# Patient Record
Sex: Female | Born: 1950 | Race: White | Hispanic: No | Marital: Married | Smoking: Never smoker
Health system: Southern US, Community
[De-identification: ages and names within clinical notes are randomized; demographics above are authoritative.]

## PROBLEM LIST (undated history)

## (undated) DIAGNOSIS — R519 Headache, unspecified: Secondary | ICD-10-CM

## (undated) DIAGNOSIS — E039 Hypothyroidism, unspecified: Secondary | ICD-10-CM

## (undated) DIAGNOSIS — M810 Age-related osteoporosis without current pathological fracture: Secondary | ICD-10-CM

## (undated) HISTORY — PX: CATARACT EXTRACTION, BILATERAL: SHX1313

## (undated) HISTORY — PX: SHOULDER SURGERY: SHX246

## (undated) HISTORY — PX: LASIK: SHX215

---

## 2018-01-25 ENCOUNTER — Other Ambulatory Visit: Payer: Self-pay

## 2018-01-25 ENCOUNTER — Emergency Department (HOSPITAL_BASED_OUTPATIENT_CLINIC_OR_DEPARTMENT_OTHER): Payer: Medicare HMO

## 2018-01-25 ENCOUNTER — Emergency Department (HOSPITAL_BASED_OUTPATIENT_CLINIC_OR_DEPARTMENT_OTHER)
Admission: EM | Admit: 2018-01-25 | Discharge: 2018-01-26 | Disposition: A | Discharge status: Home / Self Care | Payer: Medicare HMO | Attending: Emergency Medicine | Admitting: Emergency Medicine

## 2018-01-25 ENCOUNTER — Encounter (HOSPITAL_BASED_OUTPATIENT_CLINIC_OR_DEPARTMENT_OTHER): Payer: Self-pay | Admitting: Emergency Medicine

## 2018-01-25 DIAGNOSIS — Y92000 Kitchen of unspecified non-institutional (private) residence as  the place of occurrence of the external cause: Secondary | ICD-10-CM | POA: Insufficient documentation

## 2018-01-25 DIAGNOSIS — Y999 Unspecified external cause status: Secondary | ICD-10-CM | POA: Insufficient documentation

## 2018-01-25 DIAGNOSIS — S42402A Unspecified fracture of lower end of left humerus, initial encounter for closed fracture: Secondary | ICD-10-CM | POA: Insufficient documentation

## 2018-01-25 DIAGNOSIS — S59902A Unspecified injury of left elbow, initial encounter: Secondary | ICD-10-CM | POA: Diagnosis present

## 2018-01-25 DIAGNOSIS — W19XXXA Unspecified fall, initial encounter: Secondary | ICD-10-CM

## 2018-01-25 DIAGNOSIS — W01198A Fall on same level from slipping, tripping and stumbling with subsequent striking against other object, initial encounter: Secondary | ICD-10-CM | POA: Diagnosis not present

## 2018-01-25 DIAGNOSIS — Y93G3 Activity, cooking and baking: Secondary | ICD-10-CM | POA: Insufficient documentation

## 2018-01-25 DIAGNOSIS — Y92009 Unspecified place in unspecified non-institutional (private) residence as the place of occurrence of the external cause: Secondary | ICD-10-CM

## 2018-01-25 NOTE — ED Notes (Signed)
Patient transported to X-ray 

## 2018-01-25 NOTE — ED Triage Notes (Signed)
Pt states she fell today and she is now having pain on from her left forearm to top of the elbow.

## 2018-01-25 NOTE — ED Notes (Signed)
ED Provider at bedside. 

## 2018-01-25 NOTE — ED Provider Notes (Signed)
[facility] Provider Note: Lowella Dell, MD, FACEP  CSN: [id number] MRN: [id number] ARRIVAL: 01/25/18 at 2304 ROOM: MH02/[facility]02   CHIEF COMPLAINT  Fall   HISTORY OF PRESENT ILLNESS  01/25/18 11:34 PM Sabrina Miller is a 67 y.o. female who was carrying some cookies in her kitchen about an hour prior to arrival.  She lost her balance and fell striking her left elbow and forearm against the dishwasher.  She is having pain about her left elbow which she rates as a 5 out of 10 at rest and 9 out of 10 with movement.  She denies injury elsewhere.  She has not taken anything for her pain.  She has no difficulty using her hand and wrist distally.   History reviewed. No pertinent past medical history.  History reviewed. No pertinent surgical history.  History reviewed. No pertinent family history.  Social History   Tobacco Use  . Smoking status: Never Smoker  . Smokeless tobacco: Never Used  Substance Use Topics  . Alcohol use: Never    Frequency: Never  . Drug use: Never    Prior to Admission medications   Not on File    Allergies Patient has no known allergies.   REVIEW OF SYSTEMS  Negative except as noted here or in the History of Present Illness.   PHYSICAL EXAMINATION  Initial Vital Signs Blood pressure 113/84, pulse 72, temperature 98 F (36.7 C), temperature source Oral, resp. rate 16, height 5\' 1"  (1.549 m), weight 62.5 kg, SpO2 100 %.  Examination General: Well-developed, well-nourished female in no acute distress; appearance consistent with age of record HENT: normocephalic; atraumatic Eyes: pupils equal, round and reactive to light; extraocular muscles intact Neck: supple; nontender Heart: regular rate and rhythm Lungs: clear to auscultation bilaterally Abdomen: soft; nondistended; nontender; bowel sounds present Extremities: No deformity; full range of motion except left elbow; pulses normal; tenderness of left elbow without ecchymosis or  swelling, left upper extremity distally neurovascularly intact Neurologic: Awake, alert and oriented; motor function intact in all extremities and symmetric; no facial droop Skin: Warm and dry Psychiatric: Normal mood and affect   RESULTS  Summary of this visit's results, reviewed by myself:   EKG Interpretation  Date/Time:    Ventricular Rate:    PR Interval:    QRS Duration:   QT Interval:    QTC Calculation:   R Axis:     Text Interpretation:        Laboratory Studies: No results found for this or any previous visit (from the past 24 hour(s)). Imaging Studies: Dg Elbow Complete Left  Result Date: 01/25/2018 CLINICAL DATA:  Fall with elbow pain EXAM: LEFT ELBOW - COMPLETE 3+ VIEW COMPARISON:  None. FINDINGS: Fat pad distention consistent with elbow effusion. No radial head dislocation. Calcification anterior to the distal cortex of the humerus, may reflect small avulsion injury. IMPRESSION: Elbow effusion with possible avulsion fracture fragment anterior to the distal humerus. Electronically Signed   By: Jasmine Pang M.D.   On: 01/25/2018 23:48   Dg Forearm Left  Result Date: 01/25/2018 CLINICAL DATA:  Fall with elbow pain EXAM: LEFT FOREARM - 2 VIEW COMPARISON:  None. FINDINGS: No acute displaced fracture or malalignment at the distal radius or ulna. Elbow effusion. Small suspected osseous fragment anterior to the distal humerus cortex. IMPRESSION: Suspected small displaced fracture fragment anterior to the distal cortex of the humerus. Elbow effusion is present Electronically Signed   By: Jasmine Pang M.D.   On: 01/25/2018  23:45    ED COURSE and MDM  Nursing notes and initial vitals signs, including pulse oximetry, reviewed.  Vitals:   01/25/18 2315  BP: 113/84  Pulse: 72  Resp: 16  Temp: 98 F (36.7 C)  TempSrc: Oral  SpO2: 100%  Weight: 62.5 kg  Height: 5\' 1"  (1.549 m)   Will place in splint and refer to Dr. Eulah PontMurphy, her orthopedist.  PROCEDURES    ED  DIAGNOSES     ICD-10-CM   1. Fall in home, initial encounter W19.XXXA    Y92.009   2. Elbow fracture, left, closed, initial encounter S42.402A        Early Ord, Jonny RuizJohn, MD 01/25/18 2356

## 2018-01-26 MED ORDER — IBUPROFEN 800 MG PO TABS
800.0000 mg | ORAL_TABLET | Freq: Once | ORAL | Status: AC
  Administered 2018-01-26: 800 mg via ORAL
  Filled 2018-01-26: qty 1

## 2018-01-28 ENCOUNTER — Other Ambulatory Visit: Payer: Self-pay

## 2018-01-28 DIAGNOSIS — M25522 Pain in left elbow: Secondary | ICD-10-CM

## 2018-01-31 ENCOUNTER — Ambulatory Visit
Admission: RE | Admit: 2018-01-31 | Discharge: 2018-01-31 | Disposition: A | Discharge status: Home / Self Care | Payer: Medicare HMO | Source: Ambulatory Visit | Attending: Orthopedic Surgery | Admitting: Orthopedic Surgery

## 2018-01-31 DIAGNOSIS — M25522 Pain in left elbow: Secondary | ICD-10-CM

## 2019-06-06 IMAGING — CT CT ELBOW*L* W/O CM
4 of 10 series · 13 of 39 positions shown, 15 images · non-contrast
Comparison: Plain films left elbow 01/25/2018.

CLINICAL DATA: Patient suffered a distal left humerus fracture due
to a trip and fall over a dishwasher 01/25/2018. Initial encounter.

EXAM:
CT OF THE UPPER LEFT EXTREMITY WITHOUT CONTRAST
TECHNIQUE: Multidetector CT imaging of the upper left extremity was performed
according to the standard protocol.

[Series 3: elbow 1.50 br60 s3 ax axial bone hd fov · axial · 0.29mm/px · z∈[-960,-835]mm · 5 of 237 slices shown, 7 images]
[im 40/237  soft-tissue]
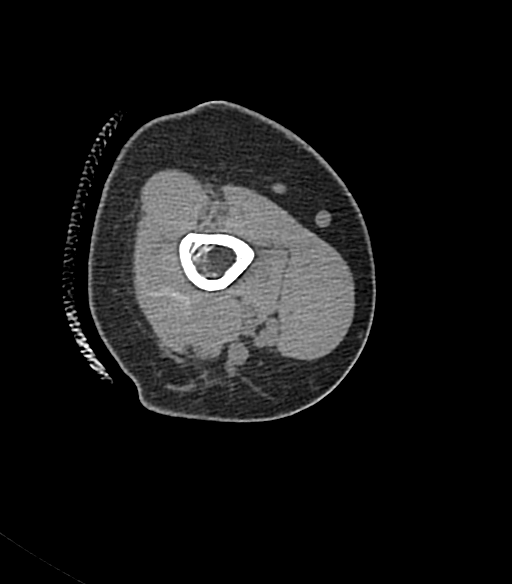
[im 40/237  bone]
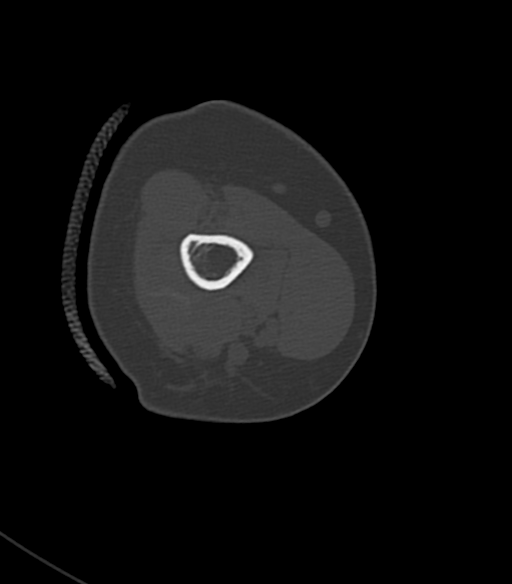
[im 79/237  bone]
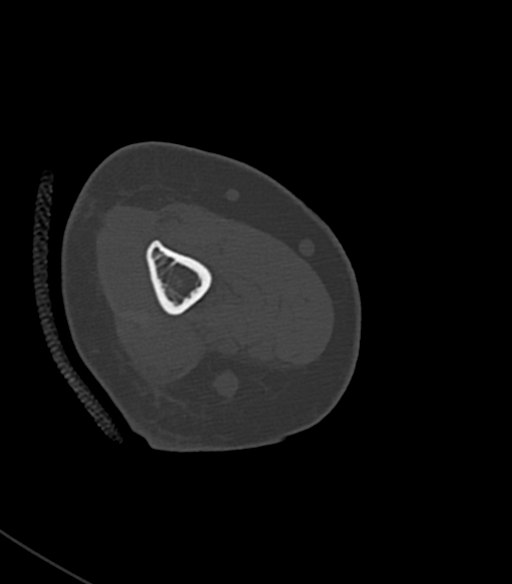
[im 119/237  bone]
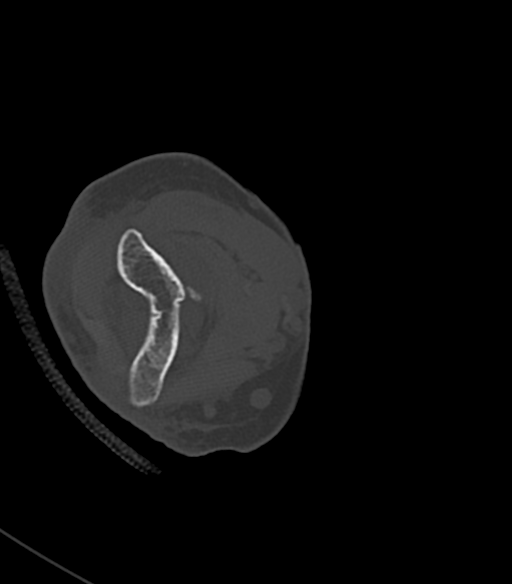
[im 158/237  bone]
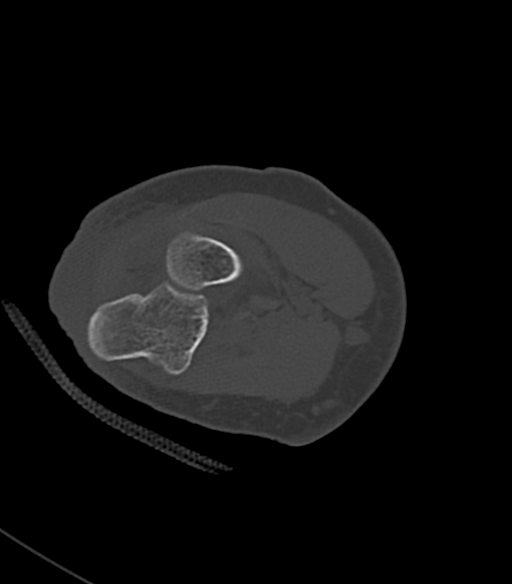
[im 197/237  soft-tissue]
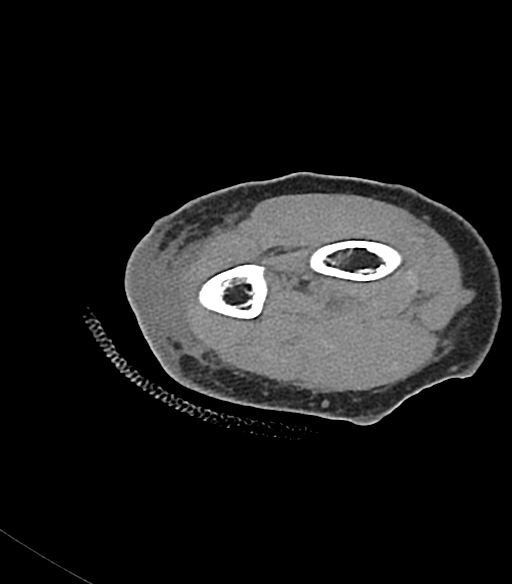
[im 197/237  bone]
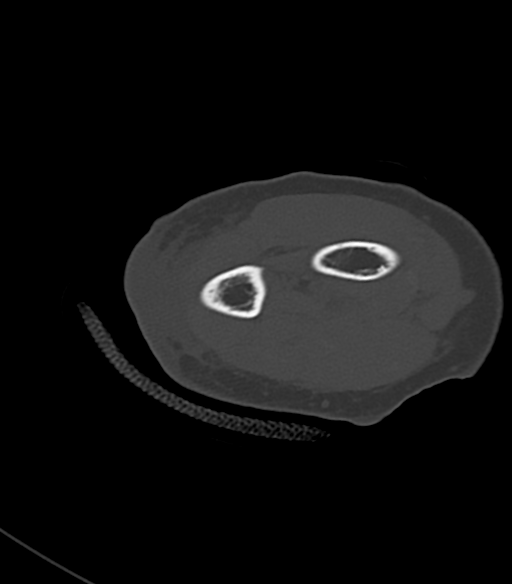

[Series 5: elbow 1.50 br40 s3 ax axial st hd fov · axial · 0.29mm/px · z∈[-960,-835]mm · 5 of 237 slices shown]
[im 40/237  bone]
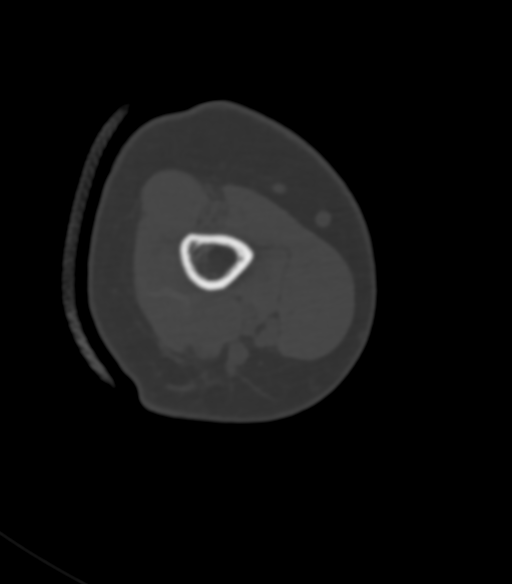
[im 79/237  bone]
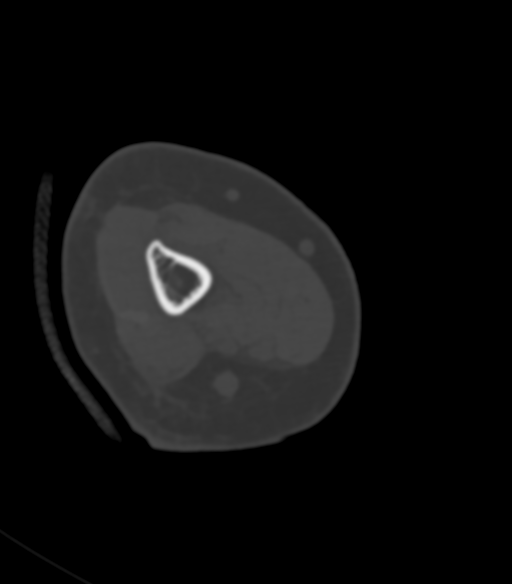
[im 119/237  bone]
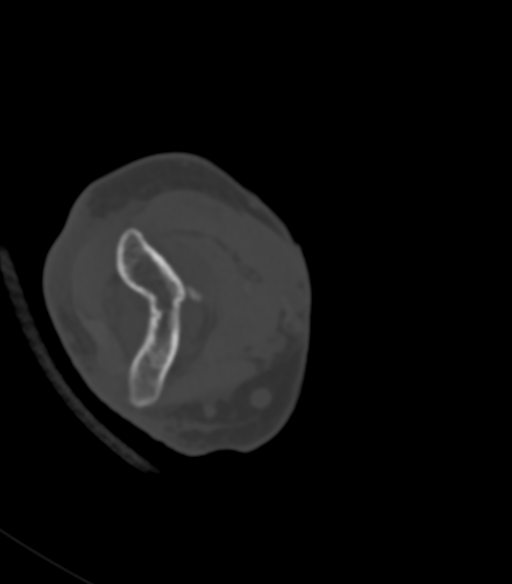
[im 158/237  bone]
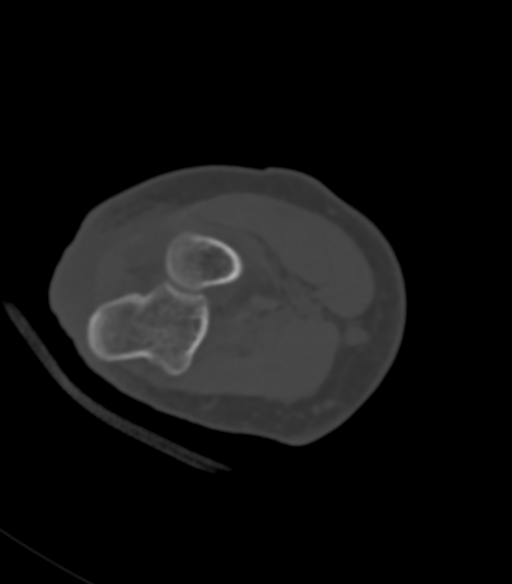
[im 197/237  bone]
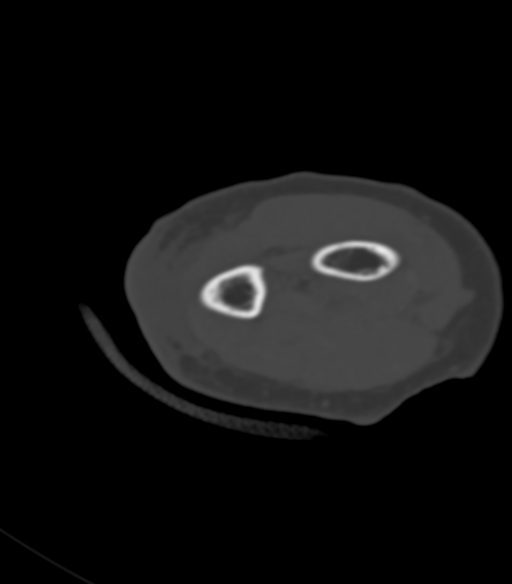

[Series 9: elbow 1.50 hr60 s3 sag cor bone hd fov · sagittal · 0.34mm/px · 2 of 135 slices shown]
[im 46/135  soft-tissue]
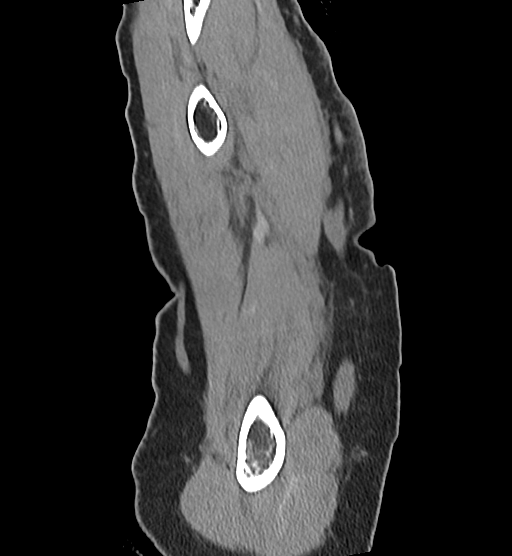
[im 68/135  bone]
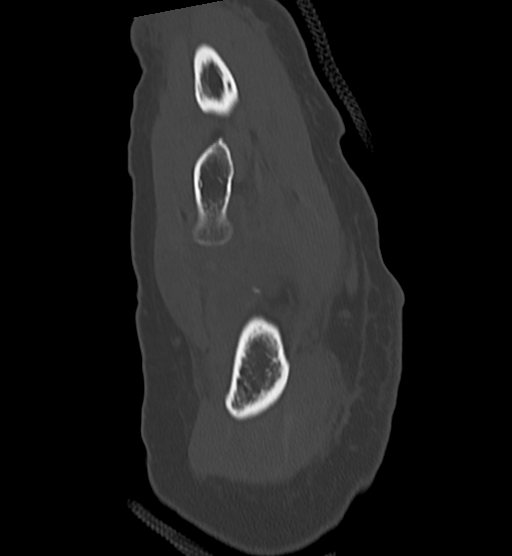

[Series 13: elbow 1.50 br60 s3 cor sag bone hd fov · coronal · 0.24mm/px · 1 of 145 slices shown]
[im 73/145  bone]
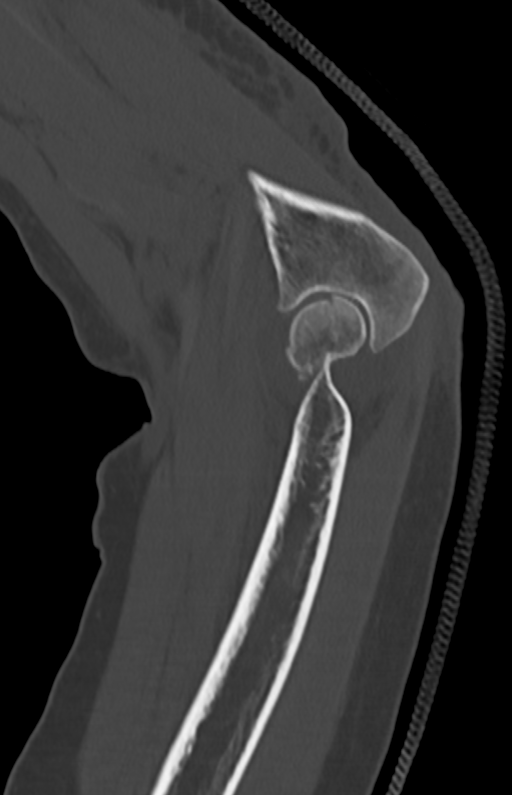

[13 of 39 positions shown; findings below may reference images not displayed]

FINDINGS: Bones/Joint/Cartilage

As seen on the comparison plain films, the patient has a fracture of
the distal humerus. The capitellum is impacted approximately 0.3 cm
and anteriorly displaced 0.5 cm. The majority of the articular
surface of the capitellum is not in contact with the articular
surface of the radial head. The far lateral periphery of the
capitellum is mildly comminuted but the vast majority of its
articular surface is intact. The patient's fracture extends through
the central aspect of the trochlea without displacement. The medial
and lateral epicondyles are spared. The radius and ulna are intact.

Ligaments

Suboptimally assessed by CT.

Muscles and Tendons

Intact.

Soft tissues

The patient has a moderate elbow joint effusion. A bone fragment
measuring 0.3 cm is seen in the anterior aspect of the joint and
likely a fracture fragment. Fluid and hemorrhage are in the
olecranon bursa.
IMPRESSION: Fracture of the distal humerus as described above. The capitellum is
mildly impacted anterior displaced. The bulk of the capitellum does
not articulate with the radius. Nondisplaced component of the
fracture extends through the central trochlea.

Elbow joint effusion with a 0.3 cm fracture fragment within it.
Hemorrhage in the olecranon bursa is noted.

## 2019-08-05 ENCOUNTER — Other Ambulatory Visit (HOSPITAL_COMMUNITY)
Admission: RE | Admit: 2019-08-05 | Discharge: 2019-08-05 | Disposition: A | Discharge status: Home / Self Care | Payer: Medicare HMO | Source: Ambulatory Visit | Attending: Orthopaedic Surgery | Admitting: Orthopaedic Surgery

## 2019-08-05 ENCOUNTER — Encounter (HOSPITAL_BASED_OUTPATIENT_CLINIC_OR_DEPARTMENT_OTHER): Payer: Self-pay | Admitting: Orthopaedic Surgery

## 2019-08-05 ENCOUNTER — Other Ambulatory Visit: Payer: Self-pay

## 2019-08-05 DIAGNOSIS — Z01812 Encounter for preprocedural laboratory examination: Secondary | ICD-10-CM | POA: Diagnosis present

## 2019-08-05 DIAGNOSIS — Z20822 Contact with and (suspected) exposure to covid-19: Secondary | ICD-10-CM | POA: Diagnosis not present

## 2019-08-05 LAB — SARS CORONAVIRUS 2 (TAT 6-24 HRS): SARS Coronavirus 2: NEGATIVE

## 2019-08-05 NOTE — H&P (Signed)
PREOPERATIVE H&P  Chief Complaint: RIGHT WRIST FRACTURE  HPI: Sabrina Miller is a 69 y.o. female who is scheduled for OPEN REDUCTION INTERNAL FIXATION (ORIF) DISTAL RADIAL FRACTURE.   Patient has a past medical history significant for osteoporosis and hyperthyroidism.   Patient had an injury on 08/05/2019 when she fell landing on an outstretched hand. She had immediate pain in her right wrist.   Her symptoms are rated as moderate to severe, and have been worsening.  This is significantly impairing activities of daily living.    Please see clinic note for further details on this patient's care.    She has elected for surgical management.   Past Medical History:  Diagnosis Date  . Headache   . Hypothyroidism   . Osteoporosis    Past Surgical History:  Procedure Laterality Date  . LASIK    . SHOULDER SURGERY     Social History   Socioeconomic History  . Marital status: Married    Spouse name: Not on file  . Number of children: Not on file  . Years of education: Not on file  . Highest education level: Not on file  Occupational History  . Not on file  Tobacco Use  . Smoking status: Never Smoker  . Smokeless tobacco: Never Used  Substance and Sexual Activity  . Alcohol use: Never  . Drug use: Never  . Sexual activity: Never  Other Topics Concern  . Not on file  Social History Narrative  . Not on file   Social Determinants of Health   Financial Resource Strain:   . Difficulty of Paying Living Expenses:   Food Insecurity:   . Worried About Charity fundraiser in the Last Year:   . Arboriculturist in the Last Year:   Transportation Needs:   . Film/video editor (Medical):   Marland Kitchen Lack of Transportation (Non-Medical):   Physical Activity:   . Days of Exercise per Week:   . Minutes of Exercise per Session:   Stress:   . Feeling of Stress :   Social Connections:   . Frequency of Communication with Friends and Family:   . Frequency of Social Gatherings with  Friends and Family:   . Attends Religious Services:   . Active Member of Clubs or Organizations:   . Attends Archivist Meetings:   Marland Kitchen Marital Status:    History reviewed. No pertinent family history. Allergies  Allergen Reactions  . Codeine Nausea And Vomiting   Prior to Admission medications   Medication Sig Start Date End Date Taking? Authorizing Provider  aspirin EC 81 MG tablet Take 81 mg by mouth daily. Swallow whole.   Yes [provider]  calcium-vitamin D (OSCAL WITH D) 250-125 MG-UNIT tablet Take 1 tablet by mouth daily.   Yes [provider]    ROS: All other systems have been reviewed and were otherwise negative with the exception of those mentioned in the HPI and as above.  Physical Exam: General: Alert, no acute distress Cardiovascular: No pedal edema Respiratory: No cyanosis, no use of accessory musculature GI: No organomegaly, abdomen is soft and non-tender Skin: No lesions in the area of chief complaint Neurologic: Sensation intact distally Psychiatric: Patient is competent for consent with normal mood and affect Lymphatic: No axillary or cervical lymphadenopathy  MUSCULOSKELETAL:  Right wrist: tender to palpation about distal radius. ROM limited due to pain. Warm well perfused hand  Imaging: Xrays of right wrist demonstrate distal radius fracture  Assessment: RIGHT WRIST FRACTURE  Plan: Plan for Procedure(s): OPEN REDUCTION INTERNAL FIXATION (ORIF) DISTAL RADIAL FRACTURE  The risks benefits and alternatives were discussed with the patient including but not limited to the risks of nonoperative treatment, versus surgical intervention including infection, bleeding, nerve injury,  blood clots, cardiopulmonary complications, morbidity, mortality, among others, and they were willing to proceed.   The patient acknowledged the explanation, agreed to proceed with the plan and consent was signed.   Operative Plan: ORIF right distal  radius fracture Discharge Medications: Standard DVT Prophylaxis: None Physical Therapy: +/- outpatient OT Special Discharge needs: Splint. Sling for comfort    Vernetta Honey, PA-C  08/05/2019 5:02 PM

## 2019-08-06 ENCOUNTER — Ambulatory Visit (HOSPITAL_BASED_OUTPATIENT_CLINIC_OR_DEPARTMENT_OTHER): Payer: Medicare HMO | Admitting: Anesthesiology

## 2019-08-06 ENCOUNTER — Other Ambulatory Visit: Payer: Self-pay

## 2019-08-06 ENCOUNTER — Encounter (HOSPITAL_BASED_OUTPATIENT_CLINIC_OR_DEPARTMENT_OTHER): Payer: Self-pay | Admitting: Orthopaedic Surgery

## 2019-08-06 ENCOUNTER — Ambulatory Visit (HOSPITAL_BASED_OUTPATIENT_CLINIC_OR_DEPARTMENT_OTHER)
Admission: RE | Admit: 2019-08-06 | Discharge: 2019-08-06 | Disposition: A | Discharge status: Home / Self Care | Payer: Medicare HMO | Source: Ambulatory Visit | Attending: Orthopaedic Surgery | Admitting: Orthopaedic Surgery

## 2019-08-06 ENCOUNTER — Encounter (HOSPITAL_BASED_OUTPATIENT_CLINIC_OR_DEPARTMENT_OTHER)
Admission: RE | Disposition: A | Discharge status: Home / Self Care | Payer: Self-pay | Source: Ambulatory Visit | Attending: Orthopaedic Surgery

## 2019-08-06 DIAGNOSIS — W19XXXA Unspecified fall, initial encounter: Secondary | ICD-10-CM | POA: Insufficient documentation

## 2019-08-06 DIAGNOSIS — Z7982 Long term (current) use of aspirin: Secondary | ICD-10-CM | POA: Diagnosis not present

## 2019-08-06 DIAGNOSIS — Z885 Allergy status to narcotic agent status: Secondary | ICD-10-CM | POA: Diagnosis not present

## 2019-08-06 DIAGNOSIS — S52551A Other extraarticular fracture of lower end of right radius, initial encounter for closed fracture: Secondary | ICD-10-CM | POA: Diagnosis not present

## 2019-08-06 HISTORY — DX: Headache, unspecified: R51.9

## 2019-08-06 HISTORY — DX: Hypothyroidism, unspecified: E03.9

## 2019-08-06 HISTORY — PX: OPEN REDUCTION INTERNAL FIXATION (ORIF) DISTAL RADIAL FRACTURE: SHX5989

## 2019-08-06 HISTORY — DX: Age-related osteoporosis without current pathological fracture: M81.0

## 2019-08-06 SURGERY — OPEN REDUCTION INTERNAL FIXATION (ORIF) DISTAL RADIUS FRACTURE
Anesthesia: Regional | Site: Wrist | Laterality: Right

## 2019-08-06 MED ORDER — PROPOFOL 500 MG/50ML IV EMUL
INTRAVENOUS | Status: DC | PRN
  Administered 2019-08-06: 50 ug/kg/min via INTRAVENOUS

## 2019-08-06 MED ORDER — CLONIDINE HCL (ANALGESIA) 100 MCG/ML EP SOLN
EPIDURAL | Status: DC | PRN
  Administered 2019-08-06: 80 ug

## 2019-08-06 MED ORDER — ACETAMINOPHEN 500 MG PO TABS
1000.0000 mg | ORAL_TABLET | Freq: Three times a day (TID) | ORAL | 0 refills | Status: AC
Start: 2019-08-06 — End: 2019-08-20

## 2019-08-06 MED ORDER — ROPIVACAINE HCL 7.5 MG/ML IJ SOLN
INTRAMUSCULAR | Status: DC | PRN
  Administered 2019-08-06: 40 mL via PERINEURAL

## 2019-08-06 MED ORDER — LACTATED RINGERS IV SOLN: INTRAVENOUS | Status: DC

## 2019-08-06 MED ORDER — VANCOMYCIN HCL 1000 MG IV SOLR
INTRAVENOUS | Status: DC | PRN
  Administered 2019-08-06: 1000 mg

## 2019-08-06 MED ORDER — PROPOFOL 10 MG/ML IV BOLUS
INTRAVENOUS | Status: DC | PRN
  Administered 2019-08-06 (×2): 10 mg via INTRAVENOUS

## 2019-08-06 MED ORDER — CEFAZOLIN SODIUM-DEXTROSE 2-4 GM/100ML-% IV SOLN
2.0000 g | INTRAVENOUS | Status: AC
  Administered 2019-08-06: 2 g via INTRAVENOUS

## 2019-08-06 MED ORDER — MIDAZOLAM HCL 2 MG/2ML IJ SOLN
2.0000 mg | Freq: Once | INTRAMUSCULAR | Status: AC
  Administered 2019-08-06: 2 mg via INTRAVENOUS

## 2019-08-06 MED ORDER — PROPOFOL 500 MG/50ML IV EMUL
INTRAVENOUS | Status: AC
  Filled 2019-08-06: qty 50

## 2019-08-06 MED ORDER — FENTANYL CITRATE (PF) 100 MCG/2ML IJ SOLN
INTRAMUSCULAR | Status: AC
  Filled 2019-08-06: qty 2

## 2019-08-06 MED ORDER — CEFAZOLIN SODIUM-DEXTROSE 2-4 GM/100ML-% IV SOLN
INTRAVENOUS | Status: AC
  Filled 2019-08-06: qty 100

## 2019-08-06 MED ORDER — FENTANYL CITRATE (PF) 100 MCG/2ML IJ SOLN
50.0000 ug | Freq: Once | INTRAMUSCULAR | Status: AC
  Administered 2019-08-06: 50 ug via INTRAVENOUS

## 2019-08-06 MED ORDER — ONDANSETRON HCL 4 MG/2ML IJ SOLN
INTRAMUSCULAR | Status: AC
  Filled 2019-08-06: qty 2

## 2019-08-06 MED ORDER — DEXAMETHASONE SODIUM PHOSPHATE 4 MG/ML IJ SOLN
INTRAMUSCULAR | Status: DC | PRN
Start: 2019-08-06 — End: 2019-08-06
  Administered 2019-08-06: 8 mg via PERINEURAL

## 2019-08-06 MED ORDER — MELOXICAM 7.5 MG PO TABS
7.5000 mg | ORAL_TABLET | Freq: Every day | ORAL | 0 refills | Status: AC
Start: 2019-08-06 — End: 2019-09-05

## 2019-08-06 MED ORDER — ONDANSETRON HCL 4 MG/2ML IJ SOLN
INTRAMUSCULAR | Status: DC | PRN
  Administered 2019-08-06: 4 mg via INTRAVENOUS

## 2019-08-06 MED ORDER — ONDANSETRON HCL 4 MG PO TABS: 4.0000 mg | ORAL_TABLET | Freq: Three times a day (TID) | ORAL | 1 refills | Status: AC | PRN

## 2019-08-06 MED ORDER — VANCOMYCIN HCL 1000 MG IV SOLR
INTRAVENOUS | Status: AC
  Filled 2019-08-06: qty 1000

## 2019-08-06 MED ORDER — MIDAZOLAM HCL 2 MG/2ML IJ SOLN
INTRAMUSCULAR | Status: AC
  Filled 2019-08-06: qty 2

## 2019-08-06 MED ORDER — EPHEDRINE SULFATE 50 MG/ML IJ SOLN
INTRAMUSCULAR | Status: DC | PRN
Start: 2019-08-06 — End: 2019-08-06
  Administered 2019-08-06 (×2): 5 mg via INTRAVENOUS

## 2019-08-06 MED ORDER — OXYCODONE HCL 5 MG PO TABS: ORAL_TABLET | ORAL | 0 refills | Status: AC

## 2019-08-06 SURGICAL SUPPLY — 71 items
BIT DRILL 2.2 SS TIBIAL (BIT) ×3 IMPLANT
BLADE HEX COATED 2.75 (ELECTRODE) ×3 IMPLANT
BLADE SURG 15 STRL LF DISP TIS (BLADE) ×1 IMPLANT
BLADE SURG 15 STRL SS (BLADE) ×2
BNDG COHESIVE 3X5 TAN STRL LF (GAUZE/BANDAGES/DRESSINGS) ×3 IMPLANT
BNDG ELASTIC 3X5.8 VLCR STR LF (GAUZE/BANDAGES/DRESSINGS) ×3 IMPLANT
BNDG ELASTIC 4X5.8 VLCR STR LF (GAUZE/BANDAGES/DRESSINGS) ×3 IMPLANT
BNDG ESMARK 4X9 LF (GAUZE/BANDAGES/DRESSINGS) ×3 IMPLANT
BRUSH SCRUB EZ PLAIN DRY (MISCELLANEOUS) IMPLANT
CANISTER SUCT 1200ML W/VALVE (MISCELLANEOUS) IMPLANT
CLOSURE STERI-STRIP 1/2X4 (GAUZE/BANDAGES/DRESSINGS) ×1
CLSR STERI-STRIP ANTIMIC 1/2X4 (GAUZE/BANDAGES/DRESSINGS) ×2 IMPLANT
COVER BACK TABLE 60X90IN (DRAPES) ×3 IMPLANT
COVER WAND RF STERILE (DRAPES) IMPLANT
CUFF TOURN SGL QUICK 18X4 (TOURNIQUET CUFF) ×3 IMPLANT
DECANTER SPIKE VIAL GLASS SM (MISCELLANEOUS) IMPLANT
DRAPE EXTREMITY T 121X128X90 (DISPOSABLE) ×3 IMPLANT
DRAPE IMP U-DRAPE 54X76 (DRAPES) ×3 IMPLANT
DRAPE OEC MINIVIEW 54X84 (DRAPES) ×3 IMPLANT
DRAPE SURG 17X23 STRL (DRAPES) ×3 IMPLANT
ELECT REM PT RETURN 9FT ADLT (ELECTROSURGICAL) ×3
ELECTRODE REM PT RTRN 9FT ADLT (ELECTROSURGICAL) ×1 IMPLANT
GAUZE SPONGE 4X4 12PLY STRL (GAUZE/BANDAGES/DRESSINGS) ×3 IMPLANT
GLOVE BIO SURGEON STRL SZ 6.5 (GLOVE) ×2 IMPLANT
GLOVE BIO SURGEONS STRL SZ 6.5 (GLOVE) ×1
GLOVE BIOGEL PI IND STRL 6.5 (GLOVE) ×1 IMPLANT
GLOVE BIOGEL PI IND STRL 7.0 (GLOVE) ×1 IMPLANT
GLOVE BIOGEL PI IND STRL 8 (GLOVE) ×1 IMPLANT
GLOVE BIOGEL PI INDICATOR 6.5 (GLOVE) ×2
GLOVE BIOGEL PI INDICATOR 7.0 (GLOVE) ×2
GLOVE BIOGEL PI INDICATOR 8 (GLOVE) ×2
GLOVE ECLIPSE 6.5 STRL STRAW (GLOVE) ×3 IMPLANT
GLOVE ECLIPSE 8.0 STRL XLNG CF (GLOVE) ×3 IMPLANT
GOWN STRL REUS W/ TWL LRG LVL3 (GOWN DISPOSABLE) ×2 IMPLANT
GOWN STRL REUS W/ TWL XL LVL3 (GOWN DISPOSABLE) IMPLANT
GOWN STRL REUS W/TWL LRG LVL3 (GOWN DISPOSABLE) ×4
GOWN STRL REUS W/TWL XL LVL3 (GOWN DISPOSABLE) ×3 IMPLANT
K-WIRE 1.6 (WIRE) ×6
K-WIRE FX5X1.6XNS BN SS (WIRE) ×3
KWIRE FX5X1.6XNS BN SS (WIRE) ×3 IMPLANT
NEEDLE HYPO 25X1 1.5 SAFETY (NEEDLE) ×3 IMPLANT
NS IRRIG 1000ML POUR BTL (IV SOLUTION) ×3 IMPLANT
PAD CAST 4YDX4 CTTN HI CHSV (CAST SUPPLIES) ×1 IMPLANT
PADDING CAST COTTON 4X4 STRL (CAST SUPPLIES) ×2
PEG LOCKING SMOOTH 2.2X18 (Peg) ×3 IMPLANT
PEG LOCKING SMOOTH 2.2X20 (Screw) ×12 IMPLANT
PENCIL SMOKE EVACUATOR (MISCELLANEOUS) ×3 IMPLANT
PLATE CROSSLOCK NAR MINI RT (Plate) ×3 IMPLANT
SCREW LOCK 14X2.7X 3 LD TPR (Screw) ×3 IMPLANT
SCREW LOCKING 2.7X14 (Screw) ×6 IMPLANT
SCREW NONLOCK 2.7X18MM (Screw) ×3 IMPLANT
SET BASIN DAY SURGERY F.S. (CUSTOM PROCEDURE TRAY) ×3 IMPLANT
SLEEVE SCD COMPRESS KNEE MED (MISCELLANEOUS) IMPLANT
SLING ARM FOAM STRAP MED (SOFTGOODS) ×3 IMPLANT
SPLINT FAST PLASTER 5X30 (CAST SUPPLIES) ×10
SPLINT PLASTER CAST FAST 5X30 (CAST SUPPLIES) ×5 IMPLANT
SPLINT PLASTER CAST XFAST 3X15 (CAST SUPPLIES) IMPLANT
SPLINT PLASTER XTRA FASTSET 3X (CAST SUPPLIES)
SPONGE LAP 4X18 RFD (DISPOSABLE) IMPLANT
SUCTION FRAZIER HANDLE 10FR (MISCELLANEOUS)
SUCTION TUBE FRAZIER 10FR DISP (MISCELLANEOUS) IMPLANT
SUT MNCRL AB 4-0 PS2 18 (SUTURE) ×3 IMPLANT
SUT VIC AB 3-0 SH 27 (SUTURE)
SUT VIC AB 3-0 SH 27X BRD (SUTURE) IMPLANT
SYR BULB EAR ULCER 3OZ GRN STR (SYRINGE) ×3 IMPLANT
SYR CONTROL 10ML LL (SYRINGE) ×3 IMPLANT
TOWEL GREEN STERILE FF (TOWEL DISPOSABLE) ×3 IMPLANT
TRAY DSU PREP LF (CUSTOM PROCEDURE TRAY) ×3 IMPLANT
TUBE CONNECTING 20'X1/4 (TUBING) ×1
TUBE CONNECTING 20X1/4 (TUBING) ×2 IMPLANT
YANKAUER SUCT BULB TIP NO VENT (SUCTIONS) IMPLANT

## 2019-08-06 NOTE — Anesthesia Preprocedure Evaluation (Signed)
Anesthesia Evaluation    Reviewed: Allergy & Precautions, Patient's Chart, lab work & pertinent test results, Unable to perform ROS - Chart review only  Airway Mallampati: II  TM Distance: >3 FB Neck ROM: Full    Dental  (+) Dental Advisory Given   Pulmonary neg pulmonary ROS,    Pulmonary exam normal breath sounds clear to auscultation       Cardiovascular negative cardio ROS Normal cardiovascular exam Rhythm:Regular Rate:Normal     Neuro/Psych  Headaches,    GI/Hepatic negative GI ROS, Neg liver ROS,   Endo/Other  Hypothyroidism   Renal/GU negative Renal ROS     Musculoskeletal negative musculoskeletal ROS (+)   Abdominal   Peds  Hematology negative hematology ROS (+)   Anesthesia Other Findings   Reproductive/Obstetrics                             Anesthesia Physical Anesthesia Plan  ASA: II  Anesthesia Plan: Regional   Post-op Pain Management:    Induction: Intravenous  PONV Risk Score and Plan: 2 and Propofol infusion, Treatment may vary due to age or medical condition, TIVA and Ondansetron  Airway Management Planned: Natural Airway  Additional Equipment:   Intra-op Plan:   Post-operative Plan:   Informed Consent: I have reviewed the patients History and Physical, chart, labs and discussed the procedure including the risks, benefits and alternatives for the proposed anesthesia with the patient or authorized representative who has indicated his/her understanding and acceptance.     Dental advisory given  Plan Discussed with: CRNA  Anesthesia Plan Comments:         Anesthesia Quick Evaluation

## 2019-08-06 NOTE — Anesthesia Postprocedure Evaluation (Signed)
Anesthesia Post Note  Patient: Sabrina Miller  Procedure(s) Performed: OPEN REDUCTION INTERNAL FIXATION (ORIF) DISTAL RADIAL FRACTURE (Right Wrist)     Patient location during evaluation: PACU Anesthesia Type: Regional Level of consciousness: awake and alert Pain management: pain level controlled Vital Signs Assessment: post-procedure vital signs reviewed and stable Respiratory status: spontaneous breathing Cardiovascular status: stable Anesthetic complications: no   No complications documented.  Last Vitals:  Vitals:   08/06/19 1500 08/06/19 1530  BP: (!) 102/54 101/82  Pulse: 73 72  Resp: 16 16  Temp:  36.6 C  SpO2: 94% 96%    Last Pain:  Vitals:   08/06/19 1530  TempSrc:   PainSc: 0-No pain                 Lewie Loron

## 2019-08-06 NOTE — Op Note (Signed)
Orthopaedic Surgery Operative Note (CSN: [id number])  Sabrina Miller  02/09/1951 Date of Surgery: 08/06/2019   Diagnoses:  Right extra-articular distal radius fracture with dorsal comminution  Procedure: ORIF right extra-articular distal radius fracture   Operative Finding Successful completion of the planned procedure.  Reasonable bone quality and anatomic reduction.  Case was without issues, we visualized the palmar cutaneous branch and protected it.  Post-operative plan: The patient will be nonweightbearing in a demisplint.  The patient will be discharged home.  DVT prophylaxis not indicated in this ambulatory upper extremity patient without significant risk factors.   Pain control with PRN pain medication preferring oral medicines.  Follow up plan will be scheduled in approximately 7 days for incision check and XR.  Post-Op Diagnosis: Same Surgeons:Primary: Hiram Gash, MD Assistants:Caroline McBane PA-C Location: [facility] OR ROOM 6 Anesthesia: Sedation plus regional anesthesia Antibiotics: Ancef 2 g with local vancomycin powder 1 g at the surgical site Tourniquet time:  Total Tourniquet Time Documented: Upper Arm (Right) - 19 minutes Total: Upper Arm (Right) - 19 minutes  Estimated Blood Loss: Minimal Complications: None Specimens: None Implants: Implant Name Type Inv. Item Serial No. Manufacturer Lot No. LRB No. Used Action  PLATE CROSSLOCK NAR MINI RT - [id number] Plate PLATE CROSSLOCK NAR MINI RT  ZIMMER RECON(ORTH,TRAU,BIO,SG) ON SET Right 1 Implanted  SCREW NONLOCK 2.7X18MM - [id number] Screw SCREW NONLOCK 2.7X18MM  ZIMMER RECON(ORTH,TRAU,BIO,SG) ON SET Right 1 Implanted  PEG LOCKING SMOOTH 2.2X20 - [id number] Screw PEG LOCKING SMOOTH 2.2X20  ZIMMER RECON(ORTH,TRAU,BIO,SG) ON SET Right 4 Implanted  PEG LOCKING SMOOTH 2.2X18 - [id number] Peg PEG LOCKING SMOOTH 2.2X18  ZIMMER RECON(ORTH,TRAU,BIO,SG) ON SET Right 1 Implanted  SCREW LOCKING 2.7X14 - [id number] Screw SCREW LOCKING 2.7X14   ZIMMER RECON(ORTH,TRAU,BIO,SG) ON SET Right 3 Implanted    Indications for Surgery:   Sabrina Miller is a 69 y.o. female with fall resulting in a comminuted extra-articular distal radius fracture with dorsal tilt.  We discussed the possibility of nonoperative management however the patient was unwilling to go through casting and with her dorsal comminution we worried that she would lose her reduction in the setting of an active healthy patient.  Benefits and risks of operative and nonoperative management were discussed prior to surgery with patient/guardian(s) and informed consent form was completed.  Specific risks including infection, need for additional surgery, nonunion, malunion, nerve or vessel damage amongst others.   Procedure:   The patient was identified properly. Informed consent was obtained and the surgical site was marked. The patient was taken up to suite where general anesthesia was induced.  The patient was positioned supine on a hand table.  The right wrist was prepped and draped in the usual sterile fashion.  Timeout was performed before the beginning of the case.  Tourniquet was used for the above duration.  An FCR approach was made exposing the volar surface of the distal radius taking care to go through the sheath of the FCR tendon tract and ulnarly exposing the inferior portion of the sheath while protecting the median nerve and radial artery on each side with blunt retractors.  This inferior portion of the sheath was incised sharply and examined for presence of the palmar cutaneous branch of the median nerve.  It was determined to not be within the field and we carried our dissection deeply to the bone splitting the pronator quadratus and exposing the fracture site.    Appropriate reduction was obtained and a narrow short DVR Biomet plate was placed and  checked for sizing and reduction under fluoroscopy.  This reduction was held in place with K wires and a K wire was placed into the  radial styloid.    Once appropriate reduction was confirmed we then proceeded to fix the plate proximally and then proceeded to fill the distal holes with a combination of partially threaded screws and pegs.  At this point we checked our reduction to ensure that there was no intra-articular extension of our screws.  Once this was confirmed we proceeded to fill remaining 3 proximal shaft screws and obtained final images which demonstrated appropriate reduction and maintenance of alignment.  The DRUJ was checked and found to be stable.  We verified that all fast guides were removed on XR and through count.    The wound was thoroughly irrigated.  The tourniquet was released prior to skin closure to verify there was no excessive bleeding and we visualized that the radial artery and median nerve were intact at the end of the case. The PQ was reapproximated grossly prior to skin closure.     We irrigated the wound copiously before placing local antibiotic as listed above.  We closed the incision in a multilayer fashion with absorbable suture.  Sterile dressing was placed.  Demisplint was placed.  Patient was awoken taken to PACU in stable condition.  Alfonse Alpers, PA-C, present and scrubbed throughout the case, critical for completion in a timely fashion, and for retraction, instrumentation, closure.

## 2019-08-06 NOTE — Discharge Instructions (Signed)
  Post Anesthesia Home Care Instructions  Activity: Get plenty of rest for the remainder of the day. A responsible individual must stay with you for 24 hours following the procedure.  For the next 24 hours, DO NOT: -Drive a car -Operate machinery -Drink alcoholic beverages -Take any medication unless instructed by your physician -Make any legal decisions or sign important papers.  Meals: Start with liquid foods such as gelatin or soup. Progress to regular foods as tolerated. Avoid greasy, spicy, heavy foods. If nausea and/or vomiting occur, drink only clear liquids until the nausea and/or vomiting subsides. Call your physician if vomiting continues.  Special Instructions/Symptoms: Your throat may feel dry or sore from the anesthesia or the breathing tube placed in your throat during surgery. If this causes discomfort, gargle with warm salt water. The discomfort should disappear within 24 hours.     Regional Anesthesia Blocks  1. Numbness or the inability to move the "blocked" extremity may last from 3-48 hours after placement. The length of time depends on the medication injected and your individual response to the medication. If the numbness is not going away after 48 hours, call your surgeon.  2. The extremity that is blocked will need to be protected until the numbness is gone and the  Strength has returned. Because you cannot feel it, you will need to take extra care to avoid injury. Because it may be weak, you may have difficulty moving it or using it. You may not know what position it is in without looking at it while the block is in effect.  3. For blocks in the legs and feet, returning to weight bearing and walking needs to be done carefully. You will need to wait until the numbness is entirely gone and the strength has returned. You should be able to move your leg and foot normally before you try and bear weight or walk. You will need someone to be with you when you first try to  ensure you do not fall and possibly risk injury.  4. Bruising and tenderness at the needle site are common side effects and will resolve in a few days.  5. Persistent numbness or new problems with movement should be communicated to the surgeon or the [facility] ([phone])/ [facility] ([phone]). 

## 2019-08-06 NOTE — Transfer of Care (Signed)
Immediate Anesthesia Transfer of Care Note  Patient: Sabrina Miller  Procedure(s) Performed: OPEN REDUCTION INTERNAL FIXATION (ORIF) DISTAL RADIAL FRACTURE (Right Wrist)  Patient Location: PACU  Anesthesia Type:MAC combined with regional for post-op pain  Level of Consciousness: sedated  Airway & Oxygen Therapy: Patient Spontanous Breathing  Post-op Assessment: Report given to RN and Post -op Vital signs reviewed and stable  Post vital signs: Reviewed and stable  Last Vitals:  Vitals Value Taken Time  BP 103/68 08/06/19 1430  Temp    Pulse 76 08/06/19 1432  Resp 9 08/06/19 1432  SpO2 95 % 08/06/19 1432  Vitals shown include unvalidated device data.  Last Pain:  Vitals:   08/06/19 1205  TempSrc: Oral  PainSc: 3       Patients Stated Pain Goal: 6 (91/91/66 0600)  Complications: No complications documented.

## 2019-08-06 NOTE — Progress Notes (Addendum)
Assisted Dr. Renold Don with right, ultrasound guided, interscalene, brachial plexus block. Side rails up, monitors on throughout procedure. See vital signs in flow sheet. Tolerated Procedure well.

## 2019-08-06 NOTE — Anesthesia Procedure Notes (Signed)
Anesthesia Regional Block: Interscalene brachial plexus block   Pre-Anesthetic Checklist: ,, timeout performed, Correct Patient, Correct Site, Correct Laterality, Correct Procedure, Correct Position, site marked, Risks and benefits discussed,  Surgical consent,  Pre-op evaluation,  At surgeon's request and post-op pain management  Laterality: Right  Prep: chloraprep       Needles:  Injection technique: Single-shot  Needle Type: Stimiplex          Additional Needles:   Procedures:,,,, ultrasound used (permanent image in chart),,,,  Narrative:  Start time: 08/06/2019 12:48 PM End time: 08/06/2019 12:53 PM  Performed by: Personally  Anesthesiologist: Lewie Loron, MD  Additional Notes: Patient tolerated the procedure well without complications

## 2019-08-06 NOTE — Interval H&P Note (Signed)
History and Physical Interval Note:  08/06/2019 12:40 PM  Sabrina Miller  has presented today for surgery, with the diagnosis of RIGHT WRIST FRACTURE.  The various methods of treatment have been discussed with the patient and family. After consideration of risks, benefits and other options for treatment, the patient has consented to  Procedure(s): OPEN REDUCTION INTERNAL FIXATION (ORIF) DISTAL RADIAL FRACTURE (Right) as a surgical intervention.  The patient's history has been reviewed, patient examined, no change in status, stable for surgery.  I have reviewed the patient's chart and labs.  Questions were answered to the patient's satisfaction.     Bjorn Pippin

## 2019-08-07 ENCOUNTER — Encounter (HOSPITAL_BASED_OUTPATIENT_CLINIC_OR_DEPARTMENT_OTHER): Payer: Self-pay | Admitting: Orthopaedic Surgery
